# Patient Record
Sex: Male | Born: 2000 | Race: White | Hispanic: No | Marital: Single
Health system: Southern US, Community
[De-identification: ages and names within clinical notes are randomized; demographics above are authoritative.]

## PROBLEM LIST (undated history)

## (undated) HISTORY — PX: TONSILLECTOMY: SUR1361

## (undated) HISTORY — PX: ADENOIDECTOMY: SUR15

---
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
## 2022-07-14 ENCOUNTER — Emergency Department (HOSPITAL_BASED_OUTPATIENT_CLINIC_OR_DEPARTMENT_OTHER): Payer: PRIVATE HEALTH INSURANCE

## 2022-07-14 ENCOUNTER — Emergency Department (HOSPITAL_BASED_OUTPATIENT_CLINIC_OR_DEPARTMENT_OTHER)
Admission: EM | Admit: 2022-07-14 | Discharge: 2022-07-14 | Disposition: A | Discharge status: Home / Self Care | Payer: PRIVATE HEALTH INSURANCE | Attending: Emergency Medicine | Admitting: Emergency Medicine

## 2022-07-14 ENCOUNTER — Other Ambulatory Visit: Payer: Self-pay

## 2022-07-14 ENCOUNTER — Encounter (HOSPITAL_BASED_OUTPATIENT_CLINIC_OR_DEPARTMENT_OTHER): Payer: Self-pay | Admitting: Pediatrics

## 2022-07-14 DIAGNOSIS — R079 Chest pain, unspecified: Secondary | ICD-10-CM | POA: Insufficient documentation

## 2022-07-14 MED ORDER — NAPROXEN 500 MG PO TABS: 500.0000 mg | ORAL_TABLET | Freq: Two times a day (BID) | ORAL | 0 refills | Status: AC

## 2022-07-14 NOTE — ED Provider Notes (Addendum)
[facility] Provider Note   CSN: [id number] Arrival date & time: 07/14/22  1104     History  Chief Complaint  Patient presents with   Chest Pain    Ryan Jensen is a 22 y.o. male with no past medical history presents today for evaluation of chest pain.  Patient reports he has left-sided chest pain in the last 3 days.  Pain is dull, intermittent, nonradiating.  Patient was evaluated urgent care today with normal EKG and chest x-ray read by radiologist.  However they was concerned about a gap between his lung and the chest on XR.  He denies fever, nausea, vomiting, diaphoresis, extremity weakness or numbness.   Chest Pain   History reviewed. No pertinent past medical history.   Home Medications Prior to Admission medications   Medication Sig Start Date End Date Taking? Authorizing Provider  naproxen (NAPROSYN) 500 MG tablet Take 1 tablet (500 mg total) by mouth 2 (two) times daily. 07/14/22  Yes Rex Kras, PA  bismuth subsalicylate (PEPTO BISMOL) 262 MG chewable tablet Chew 524 mg by mouth as needed. For upset stomach     [provider]  cetirizine (ZYRTEC) 10 MG tablet Take 10 mg by mouth daily.      [provider]      Allergies    Patient has no known allergies.    Review of Systems   Review of Systems  Cardiovascular:  Positive for chest pain.    Physical Exam Updated Vital Signs BP 129/79 (BP Location: Left Arm)   Pulse 80   Temp 98 F (36.7 C) (Oral)   Resp 18   Ht '6\' 1"'$  (1.854 m)   Wt 76.2 kg   SpO2 100%   BMI 22.16 kg/m  Physical Exam Vitals and nursing note reviewed.  Constitutional:      Appearance: Normal appearance.  HENT:     Head: Normocephalic and atraumatic.     Mouth/Throat:     Mouth: Mucous membranes are moist.  Eyes:     General: No scleral icterus. Cardiovascular:     Rate and Rhythm: Normal rate and regular rhythm.     Pulses: Normal pulses.     Heart sounds: Normal heart  sounds.  Pulmonary:     Effort: Pulmonary effort is normal.     Breath sounds: Normal breath sounds.  Abdominal:     General: Abdomen is flat.     Palpations: Abdomen is soft.     Tenderness: There is no abdominal tenderness.  Musculoskeletal:        General: No deformity.  Skin:    General: Skin is warm.     Findings: No rash.  Neurological:     General: No focal deficit present.     Mental Status: He is alert.  Psychiatric:        Mood and Affect: Mood normal.     ED Results / Procedures / Treatments   Labs (all labs ordered are listed, but only abnormal results are displayed) Labs Reviewed - No data to display  EKG EKG Interpretation  Date/Time:  Wednesday July 14 2022 11:21:23 EST Ventricular Rate:  70 PR Interval:  131 QRS Duration: 86 QT Interval:  346 QTC Calculation: 374 R Axis:   92 Text Interpretation: Sinus rhythm Borderline right axis deviation Confirmed by Ronnald Nian, Adam ([id number]) on 07/14/2022 11:23:51 AM  Radiology No results found.  Procedures Procedures    Medications Ordered in ED Medications - No  data to display  ED Course/ Medical Decision Making/ A&P                             Medical Decision Making Amount and/or Complexity of Data Reviewed Radiology: ordered.  Risk Prescription drug management.   This patient presents to the ED for chest pain, this involves an extensive number of treatment options, and is a complaint that carries with a high risk of complications and morbidity.  The differential diagnosis includes ACS, pericarditis, PE, pneumothorax, pneumonia, dissection.  This is not an exhaustive list.  Lab tests:  Imaging studies: I ordered imaging studies. I personally reviewed, interpreted imaging and agree with the radiologist's interpretations. The results include: Chest x-ray unremarkable.  Problem list/ ED course/ Critical interventions/ Medical management: HPI: See above Vital signs within normal range and stable  throughout visit. Laboratory/imaging studies significant for: See above. On physical examination, patient is afebrile and appears in no acute distress. Exam without evidence of volume overload so doubt heart failure. EKG without signs of active ischemia. Doubt NSTEMI given age and no co morbidities. Presentation not consistent with acute PE (PERC negative), pneumothorax (not visualized on chest xr), thoracic aortic dissection, pericarditis, tamponade, pneumonia (no infectious symptoms, clear chest xr), myocarditis (no recent illness, neg trop). HEART score:0 so plan to discharge patient home with PCP follow up. I have reviewed the patient home medicines and have made adjustments as needed.  Cardiac monitoring/EKG: The patient was maintained on a cardiac monitor.  I personally reviewed and interpreted the cardiac monitor which showed an underlying rhythm of: sinus rhythm.  Additional history obtained: External records from outside source obtained and reviewed including: Chart review including previous notes, labs, imaging.  Consultations obtained:  Disposition Continued outpatient therapy. Follow-up with PCP recommended for reevaluation of symptoms. Treatment plan discussed with patient.  Pt acknowledged understanding was agreeable to the plan. Worrisome signs and symptoms were discussed with patient, and patient acknowledged understanding to return to the ED if they noticed these signs and symptoms. Patient was stable upon discharge.   This chart was dictated using voice recognition software.  Despite best efforts to proofread,  errors can occur which can change the documentation meaning.          Final Clinical Impression(s) / ED Diagnoses Final diagnoses:  Chest pain, unspecified type    Rx / DC Orders ED Discharge Orders          Ordered    naproxen (NAPROSYN) 500 MG tablet  2 times daily        07/14/22 1137              Rex Kras, [place] 07/14/22 1149    Rex Kras,  PA 07/14/22 1154    Lennice Sites, DO 07/14/22 1215

## 2022-07-14 NOTE — Discharge Instructions (Addendum)
Your EKG and chest x-ray done at urgent care were reassuring today.  Take tylenol/ibuprofen/naproxen for pain. I recommend close follow-up with PCP for reevaluation.  Please do not hesitate to return to emergency department if worrisome signs symptoms we discussed become apparent.

## 2022-07-14 NOTE — ED Triage Notes (Signed)
C/O left sided chest pressure; was seen at [facility] and was told the was a "pocket of air" between the lungs. Patient stated pain started on Monday;
